# Patient Record
Sex: Male | Born: 1990 | Race: White | Hispanic: No | Marital: Single | Smoking: Never smoker
Health system: Southern US, Community
[De-identification: ages and names within clinical notes are randomized; demographics above are authoritative.]

## PROBLEM LIST (undated history)

## (undated) HISTORY — PX: APPENDECTOMY: SHX54

---

## 2020-12-26 ENCOUNTER — Emergency Department (HOSPITAL_COMMUNITY)

## 2020-12-26 ENCOUNTER — Other Ambulatory Visit: Payer: Self-pay

## 2020-12-26 ENCOUNTER — Encounter (HOSPITAL_COMMUNITY): Payer: Self-pay | Admitting: Physician Assistant

## 2020-12-26 ENCOUNTER — Emergency Department (HOSPITAL_COMMUNITY)
Admission: EM | Admit: 2020-12-26 | Discharge: 2020-12-26 | Disposition: A | Discharge status: Home / Self Care | Attending: Emergency Medicine | Admitting: Emergency Medicine

## 2020-12-26 DIAGNOSIS — Z8616 Personal history of COVID-19: Secondary | ICD-10-CM | POA: Insufficient documentation

## 2020-12-26 DIAGNOSIS — F419 Anxiety disorder, unspecified: Secondary | ICD-10-CM | POA: Diagnosis not present

## 2020-12-26 DIAGNOSIS — R42 Dizziness and giddiness: Secondary | ICD-10-CM | POA: Insufficient documentation

## 2020-12-26 DIAGNOSIS — R0789 Other chest pain: Secondary | ICD-10-CM | POA: Insufficient documentation

## 2020-12-26 DIAGNOSIS — R002 Palpitations: Secondary | ICD-10-CM

## 2020-12-26 DIAGNOSIS — R231 Pallor: Secondary | ICD-10-CM | POA: Insufficient documentation

## 2020-12-26 LAB — CBC
HCT: 44 % (ref 39.0–52.0)
Hemoglobin: 15.1 g/dL (ref 13.0–17.0)
MCH: 30.7 pg (ref 26.0–34.0)
MCHC: 34.3 g/dL (ref 30.0–36.0)
MCV: 89.4 fL (ref 80.0–100.0)
Platelets: 211 10*3/uL (ref 150–400)
RBC: 4.92 MIL/uL (ref 4.22–5.81)
RDW: 12.3 % (ref 11.5–15.5)
WBC: 5.6 10*3/uL (ref 4.0–10.5)
nRBC: 0 % (ref 0.0–0.2)

## 2020-12-26 LAB — COMPREHENSIVE METABOLIC PANEL
ALT: 27 U/L (ref 0–44)
AST: 22 U/L (ref 15–41)
Albumin: 4.5 g/dL (ref 3.5–5.0)
Alkaline Phosphatase: 60 U/L (ref 38–126)
Anion gap: 8 (ref 5–15)
BUN: 13 mg/dL (ref 6–20)
CO2: 24 mmol/L (ref 22–32)
Calcium: 9.4 mg/dL (ref 8.9–10.3)
Chloride: 110 mmol/L (ref 98–111)
Creatinine, Ser: 0.9 mg/dL (ref 0.61–1.24)
GFR, Estimated: >60 mL/min (ref >60)
Glucose, Bld: 140 mg/dL — ABNORMAL HIGH (ref 70–99)
Potassium: 3.3 mmol/L — ABNORMAL LOW (ref 3.5–5.1)
Sodium: 142 mmol/L (ref 135–145)
Total Bilirubin: 0.5 mg/dL (ref 0.3–1.2)
Total Protein: 7.4 g/dL (ref 6.5–8.1)

## 2020-12-26 LAB — D-DIMER, QUANTITATIVE: D-Dimer, Quant: <0.27 ug/mL-FEU (ref 0.00–0.50)

## 2020-12-26 LAB — TROPONIN I (HIGH SENSITIVITY): Troponin I (High Sensitivity): 3 ng/L (ref <18)

## 2020-12-26 MED ORDER — POTASSIUM CHLORIDE CRYS ER 20 MEQ PO TBCR
40.0000 meq | EXTENDED_RELEASE_TABLET | Freq: Once | ORAL | Status: AC
  Administered 2020-12-26: 40 meq via ORAL
  Filled 2020-12-26: qty 2

## 2020-12-26 MED ORDER — SODIUM CHLORIDE 0.9 % IV BOLUS
1000.0000 mL | Freq: Once | INTRAVENOUS | Status: AC
  Administered 2020-12-26: 1000 mL via INTRAVENOUS

## 2020-12-26 NOTE — ED Provider Notes (Signed)
[facility]-EMERGENCY DEPT Provider Note   CSN: [id number] Arrival date & time: 12/26/20  1708     History Chief Complaint  Patient presents with   Chest Pain    Patrick Rivera is a 30 y.o. male.  HPI   Pt Is a a 30 y/o male who presents to the ED today for eval of chest discomfort. States he was laying down when he suddenly felt palpitations and like his heart was beating fast. He further c/o some pressure that occurred with this episode. Discomfort was located to the left side of his chest and lasted about 5-10 minutes. Reports some associated clamminess to his palms and lightheadedness. Denies sob.   At present he feels some anxiety. States he was dx with COVID in January and since then he has had increased anxiety with intermittent sxs that are similar to today.   Drank about 8 drinks of etoh last night. Does not drink very frequently.   History reviewed. No pertinent past medical history.  There are no problems to display for this patient.   History reviewed. No pertinent surgical history.     History reviewed. No pertinent family history.  Social History   Tobacco Use   Smoking status: Never   Smokeless tobacco: Never    Home Medications Prior to Admission medications   Not on File    Allergies    Patient has no known allergies.  Review of Systems   Review of Systems  Constitutional:  Positive for diaphoresis. Negative for chills and fever.  HENT:  Negative for ear pain and sore throat.   Eyes:  Negative for pain and visual disturbance.  Respiratory:  Negative for cough and shortness of breath.   Cardiovascular:  Positive for chest pain and palpitations. Negative for leg swelling.  Gastrointestinal:  Negative for abdominal pain, constipation, diarrhea, nausea and vomiting.  Genitourinary:  Negative for dysuria and hematuria.  Musculoskeletal:  Negative for back pain.  Skin:  Positive for color change. Negative for rash.   Neurological:  Positive for light-headedness. Negative for seizures and syncope.  Psychiatric/Behavioral:  The patient is nervous/anxious.   All other systems reviewed and are negative.  Physical Exam Updated Vital Signs BP 132/79 (BP Location: Right Arm)   Pulse 86   Temp 98.7 F (37.1 C) (Oral)   Resp 15   Ht 5\' 10"  (1.778 m)   Wt 99.8 kg   SpO2 95%   BMI 31.57 kg/m   Physical Exam Vitals and nursing note reviewed.  Constitutional:      Appearance: He is well-developed.  HENT:     Head: Normocephalic and atraumatic.  Eyes:     Conjunctiva/sclera: Conjunctivae normal.  Cardiovascular:     Rate and Rhythm: Normal rate and regular rhythm.     Heart sounds: Normal heart sounds. No murmur heard. Pulmonary:     Effort: Pulmonary effort is normal. No respiratory distress.     Breath sounds: Normal breath sounds. No decreased breath sounds, wheezing or rales.  Chest:     Chest wall: No tenderness.  Abdominal:     General: Bowel sounds are normal.     Palpations: Abdomen is soft.     Tenderness: There is no abdominal tenderness. There is no guarding or rebound.  Musculoskeletal:     Cervical back: Neck supple.  Skin:    General: Skin is warm and dry.  Neurological:     Mental Status: He is alert.    ED Results /  Procedures / Treatments   Labs (all labs ordered are listed, but only abnormal results are displayed) Labs Reviewed  COMPREHENSIVE METABOLIC PANEL - Abnormal; Notable for the following components:      Result Value   Potassium 3.3 (*)    Glucose, Bld 140 (*)    All other components within normal limits  CBC  D-DIMER, QUANTITATIVE  TROPONIN I (HIGH SENSITIVITY)  TROPONIN I (HIGH SENSITIVITY)    EKG EKG Interpretation  Date/Time:  Saturday December 26 2020 17:55:56 EDT Ventricular Rate:  88 PR Interval:  125 QRS Duration: 99 QT Interval:  360 QTC Calculation: 436 R Axis:   16 Text Interpretation: Sinus rhythm No old tracing to compare Confirmed by  Meridee Score [phone]) on 12/26/2020 6:01:57 PM    Radiology DG Chest 2 View  Result Date: 12/26/2020 CLINICAL DATA:  Chest pain, palpitations EXAM: CHEST - 2 VIEW COMPARISON:  None. FINDINGS: The heart size and mediastinal contours are within normal limits. Both lungs are clear. The visualized skeletal structures are unremarkable. IMPRESSION: No acute abnormality of the lungs. Electronically Signed   By: Lauralyn Primes M.D.   On: 12/26/2020 19:04    Procedures Procedures   7:20 PM Cardiac monitoring reveals NSR, HR 80s (Rate & rhythm), as reviewed and interpreted by me. Cardiac monitoring was ordered due to palpitations and to monitor patient for dysrhythmia.   Medications Ordered in ED Medications  potassium chloride SA (KLOR-CON) CR tablet 40 mEq (has no administration in time range)  sodium chloride 0.9 % bolus 1,000 mL (1,000 mLs Intravenous New Bag/Given 12/26/20 1843)    ED Course  I have reviewed the triage vital signs and the nursing notes.  Pertinent labs & imaging results that were available during my care of the patient were reviewed by me and considered in my medical decision making (see chart for details).    MDM Rules/Calculators/A&P                          30 year old male presenting for evaluation of palpitations and increased anxiety since having COVID back in January.  Had an episode of palpitations today the last about 30 minutes and made him feel lightheaded.  He did have some chest discomfort during this time.  The episode has resolved on my evaluation but he states he is still feeling anxious.  His EKG shows normal sinus rhythm, no acute ischemic changes.  Chest x-ray reviewed/interpreted and shows no acute findings. CBC is unremarkable.  BMP shows mild hypokalemia.  Troponin negative, D-dimer negative, doubt PE.  Doubt dissection, esophageal rupture, or other emergent cause of symptoms at this time.  I did recommend that he follow-up with cardiology and likely  would benefit from a Holter monitor or Zio patch.  He is agreeable to do this.  We advised decreased caffeine consumption and decreased alcohol use as his palpitations could be related to this.  Advised on specific return precautions.  He voices understand the plan and reasons to return.  Questions answered.  Patient stable for discharge.   Final Clinical Impression(s) / ED Diagnoses Final diagnoses:  Palpitations    Rx / DC Orders ED Discharge Orders     None        Rayne Du 12/26/20 1920    Terrilee Files, MD 12/27/20 1005

## 2020-12-26 NOTE — ED Triage Notes (Signed)
Patient states he felt like his heart was going to jump out of chest. Lasted for 5 minutes. Happened 20 minutes ago. Says he Is in no pain right now. Patient says he has had ongoing cardiac issues since having covid.

## 2020-12-26 NOTE — Discharge Instructions (Signed)
You were given a referral to follow up with a cardiologist. They should call you to set up and appointment for follow up.   Please return to the emergency department for any new or worsening symptoms.

## 2020-12-27 ENCOUNTER — Encounter (HOSPITAL_COMMUNITY): Payer: Self-pay

## 2020-12-27 ENCOUNTER — Other Ambulatory Visit: Payer: Self-pay

## 2020-12-27 ENCOUNTER — Emergency Department (HOSPITAL_COMMUNITY)
Admission: EM | Admit: 2020-12-27 | Discharge: 2020-12-28 | Disposition: A | Discharge status: Home / Self Care | Attending: Emergency Medicine | Admitting: Emergency Medicine

## 2020-12-27 DIAGNOSIS — R0602 Shortness of breath: Secondary | ICD-10-CM | POA: Diagnosis not present

## 2020-12-27 DIAGNOSIS — G4700 Insomnia, unspecified: Secondary | ICD-10-CM | POA: Insufficient documentation

## 2020-12-27 DIAGNOSIS — E876 Hypokalemia: Secondary | ICD-10-CM | POA: Diagnosis not present

## 2020-12-27 DIAGNOSIS — R519 Headache, unspecified: Secondary | ICD-10-CM | POA: Diagnosis not present

## 2020-12-27 DIAGNOSIS — R0789 Other chest pain: Secondary | ICD-10-CM | POA: Insufficient documentation

## 2020-12-27 DIAGNOSIS — Z8616 Personal history of COVID-19: Secondary | ICD-10-CM | POA: Insufficient documentation

## 2020-12-27 NOTE — ED Triage Notes (Signed)
Pt was seen in the ED yesterday for chest pain, pt states that he continues to have the chest pain along with a headache and shortness of breath. Pt states that the pain and SHOB come and goes.

## 2020-12-27 NOTE — ED Provider Notes (Signed)
[facility]-EMERGENCY DEPT Provider Note   CSN: [id number] Arrival date & time: 12/27/20  2058     History Chief Complaint  Patient presents with   Chest Pain   Shortness of Breath   Headache    Patrick Rivera is a 30 y.o. male.  The history is provided by the patient.  Chest Pain Associated symptoms: headache and shortness of breath   Shortness of Breath Associated symptoms: chest pain and headaches   Headache He complains of chest pain which has been present intermittently since he had an episode of COVID in January.  Pain is sharp and described like it is still hole in his heart.  It had been intermittent, but it has been constant through the day today.  He had been seen in the ED yesterday for similar pain as well as palpitations, but he states he has not had any palpitations today.  Nothing makes the pain better, nothing makes it worse.  He has not taken anything for it.  There is slight associated dyspnea and he has also had some clammy palms but no overt diaphoresis.  He denies nausea.  He states that a friend of his had similar symptoms in were related to thyroid so he wants his thyroid levels checked.  He is a non-smoker and denies history of diabetes or hyperlipidemia.  He has had elevated blood pressure readings in the past but has never been diagnosed with hypertension.   History reviewed. No pertinent past medical history.  There are no problems to display for this patient.   History reviewed. No pertinent surgical history.     History reviewed. No pertinent family history.  Social History   Tobacco Use   Smoking status: Never   Smokeless tobacco: Never    Home Medications Prior to Admission medications   Not on File    Allergies    Patient has no known allergies.  Review of Systems   Review of Systems  Respiratory:  Positive for shortness of breath.   Cardiovascular:  Positive for chest pain.  Neurological:  Positive for  headaches.  All other systems reviewed and are negative.  Physical Exam Updated Vital Signs BP (!) 168/105   Pulse 66   Temp 98.6 F (37 C) (Oral)   Resp 16   SpO2 98%   Physical Exam Vitals and nursing note reviewed.  30 year old male, resting comfortably and in no acute distress. Vital signs are significant for elevated blood pressure. Oxygen saturation is 98%, which is normal. Head is normocephalic and atraumatic. PERRLA, EOMI. Oropharynx is clear. Neck is nontender and supple without adenopathy or JVD. Back is nontender and there is no CVA tenderness. Lungs are clear without rales, wheezes, or rhonchi. Chest is nontender. Heart has regular rate and rhythm without murmur. Abdomen is soft, flat, nontender without masses or hepatosplenomegaly and peristalsis is normoactive. Extremities have no cyanosis or edema, full range of motion is present. Skin is warm and dry without rash. Neurologic: Mental status is normal, cranial nerves are intact, there are no motor or sensory deficits.  ED Results / Procedures / Treatments   Labs (all labs ordered are listed, but only abnormal results are displayed) Labs Reviewed  COMPREHENSIVE METABOLIC PANEL - Abnormal; Notable for the following components:      Result Value   Potassium 3.3 (*)    All other components within normal limits  CBC WITH DIFFERENTIAL/PLATELET  TSH  D-DIMER, QUANTITATIVE  TROPONIN I (HIGH SENSITIVITY)  EKG EKG Interpretation  Date/Time:  Sunday December 27 2020 22:31:34 EDT Ventricular Rate:  68 PR Interval:  127 QRS Duration: 100 QT Interval:  388 QTC Calculation: 413 R Axis:   33 Text Interpretation: Sinus rhythm Borderline ST elevation, lateral leads When compared with ECG of 12/26/2020, No significant change was found Confirmed by Dell Briner ([id number]) on 12/27/2020 11:35:44 PM  Radiology DG Chest 2 View  Result Date: 12/28/2020 CLINICAL DATA:  Chest pain EXAM: CHEST - 2 VIEW COMPARISON:  12/26/2020  FINDINGS: The heart size and mediastinal contours are within normal limits. Both lungs are clear. The visualized skeletal structures are unremarkable. IMPRESSION: No active cardiopulmonary disease. Electronically Signed   By: Ashesh  Parikh MD   On: 12/28/2020 00:53   DG Chest 2 View  Result Date: 12/26/2020 CLINICAL DATA:  Chest pain, palpitations EXAM: CHEST - 2 VIEW COMPARISON:  None. FINDINGS: The heart size and mediastinal contours are within normal limits. Both lungs are clear. The visualized skeletal structures are unremarkable. IMPRESSION: No acute abnormality of the lungs. Electronically Signed   By: Alex  Bibbey M.D.   On: 12/26/2020 19:04    Procedures Procedures   Medications Ordered in ED Medications  potassium chloride SA (KLOR-CON) CR tablet 40 mEq (40 mEq Oral Given 12/28/20 0136)    ED Course  I have reviewed the triage vital signs and the nursing notes.  Pertinent labs & imaging results that were available during my care of the patient were reviewed by me and considered in my medical decision making (see chart for details).   MDM Rules/Calculators/A&P                         Chest pain which is quite atypical.  ECG is unchanged from yesterday.  Old records reviewed confirming ED visit yesterday for chest pain and palpitations at which time work-up was negative including D-dimer and chest x-ray.  We will repeat lab evaluation and chest x-ray, but patient was advised that definitive diagnosis was unlikely.  Will check TSH.  Labs are significant only for mild hypokalemia which had been present 2 days ago.  He is given a dose of oral potassium.  TSH is normal.  Patient is advised of these findings.  He is requesting some medication for sleep since he states he has not been able to get to sleep since he developed COVID.  He is given a prescription for small number of zolpidem tablets also given prescription for K-Dur.  He is referred to cardiology for further outpatient  work-up.  Final Clinical Impression(s) / ED Diagnoses Final diagnoses:  Atypical chest pain  Insomnia, unspecified type  Hypokalemia    Rx / DC Orders ED Discharge Orders          Ordered    zolpidem (AMBIEN) 10 MG tablet  At bedtime PRN        12/28/20 0143    potassium chloride SA (KLOR-CON) 20 MEQ tablet  2 times daily        12/28/20 0143    Ambulatory referral to Cardiology        06 /27/22 0144             02-07-1988, MD 12/28/20 [id number]

## 2020-12-28 ENCOUNTER — Emergency Department (HOSPITAL_COMMUNITY)

## 2020-12-28 LAB — COMPREHENSIVE METABOLIC PANEL
ALT: 23 U/L (ref 0–44)
AST: 20 U/L (ref 15–41)
Albumin: 4.7 g/dL (ref 3.5–5.0)
Alkaline Phosphatase: 55 U/L (ref 38–126)
Anion gap: 9 (ref 5–15)
BUN: 14 mg/dL (ref 6–20)
CO2: 27 mmol/L (ref 22–32)
Calcium: 9.9 mg/dL (ref 8.9–10.3)
Chloride: 105 mmol/L (ref 98–111)
Creatinine, Ser: 1.13 mg/dL (ref 0.61–1.24)
GFR, Estimated: >60 mL/min (ref >60)
Glucose, Bld: 91 mg/dL (ref 70–99)
Potassium: 3.3 mmol/L — ABNORMAL LOW (ref 3.5–5.1)
Sodium: 141 mmol/L (ref 135–145)
Total Bilirubin: 1 mg/dL (ref 0.3–1.2)
Total Protein: 7.5 g/dL (ref 6.5–8.1)

## 2020-12-28 LAB — CBC WITH DIFFERENTIAL/PLATELET
Abs Immature Granulocytes: 0.01 10*3/uL (ref 0.00–0.07)
Basophils Absolute: 0 10*3/uL (ref 0.0–0.1)
Basophils Relative: 1 %
Eosinophils Absolute: 0.1 10*3/uL (ref 0.0–0.5)
Eosinophils Relative: 1 %
HCT: 43.3 % (ref 39.0–52.0)
Hemoglobin: 15 g/dL (ref 13.0–17.0)
Immature Granulocytes: 0 %
Lymphocytes Relative: 34 %
Lymphs Abs: 2.3 10*3/uL (ref 0.7–4.0)
MCH: 31.4 pg (ref 26.0–34.0)
MCHC: 34.6 g/dL (ref 30.0–36.0)
MCV: 90.6 fL (ref 80.0–100.0)
Monocytes Absolute: 0.7 10*3/uL (ref 0.1–1.0)
Monocytes Relative: 10 %
Neutro Abs: 3.6 10*3/uL (ref 1.7–7.7)
Neutrophils Relative %: 54 %
Platelets: 222 10*3/uL (ref 150–400)
RBC: 4.78 MIL/uL (ref 4.22–5.81)
RDW: 12.1 % (ref 11.5–15.5)
WBC: 6.7 10*3/uL (ref 4.0–10.5)
nRBC: 0 % (ref 0.0–0.2)

## 2020-12-28 LAB — TROPONIN I (HIGH SENSITIVITY): Troponin I (High Sensitivity): 3 ng/L (ref <18)

## 2020-12-28 LAB — D-DIMER, QUANTITATIVE: D-Dimer, Quant: <0.27 ug/mL-FEU (ref 0.00–0.50)

## 2020-12-28 LAB — TSH: TSH: 2.215 u[IU]/mL (ref 0.350–4.500)

## 2020-12-28 MED ORDER — ZOLPIDEM TARTRATE 10 MG PO TABS: 10.0000 mg | ORAL_TABLET | Freq: Every evening | ORAL | 0 refills | Status: AC | PRN

## 2020-12-28 MED ORDER — POTASSIUM CHLORIDE CRYS ER 20 MEQ PO TBCR
40.0000 meq | EXTENDED_RELEASE_TABLET | Freq: Once | ORAL | Status: AC
  Administered 2020-12-28: 40 meq via ORAL
  Filled 2020-12-28: qty 2

## 2020-12-28 MED ORDER — POTASSIUM CHLORIDE CRYS ER 20 MEQ PO TBCR: 20.0000 meq | EXTENDED_RELEASE_TABLET | Freq: Two times a day (BID) | ORAL | 0 refills | Status: AC

## 2020-12-28 NOTE — Discharge Instructions (Addendum)
Your symptoms may be related to your COVID-19 infection in January.  Please follow-up with the cardiologist for further evaluation.

## 2020-12-29 ENCOUNTER — Other Ambulatory Visit: Payer: Self-pay

## 2020-12-29 ENCOUNTER — Encounter: Payer: Self-pay | Admitting: Cardiovascular Disease

## 2020-12-29 ENCOUNTER — Ambulatory Visit

## 2020-12-29 ENCOUNTER — Ambulatory Visit (INDEPENDENT_AMBULATORY_CARE_PROVIDER_SITE_OTHER): Admitting: Cardiovascular Disease

## 2020-12-29 VITALS — BP 118/82 | HR 65 | Ht 70.0 in | Wt 220.0 lb

## 2020-12-29 DIAGNOSIS — R072 Precordial pain: Secondary | ICD-10-CM | POA: Diagnosis not present

## 2020-12-29 DIAGNOSIS — Z01818 Encounter for other preprocedural examination: Secondary | ICD-10-CM

## 2020-12-29 DIAGNOSIS — R002 Palpitations: Secondary | ICD-10-CM | POA: Diagnosis not present

## 2020-12-29 DIAGNOSIS — R0602 Shortness of breath: Secondary | ICD-10-CM | POA: Insufficient documentation

## 2020-12-29 DIAGNOSIS — R0789 Other chest pain: Secondary | ICD-10-CM | POA: Diagnosis not present

## 2020-12-29 DIAGNOSIS — Z01812 Encounter for preprocedural laboratory examination: Secondary | ICD-10-CM

## 2020-12-29 NOTE — Assessment & Plan Note (Signed)
History of shortness of breath which occurs on a daily basis in addition to his chest pain since he had COVID back in January.  I am going to get a 2D echo to further evaluate

## 2020-12-29 NOTE — Progress Notes (Signed)
12/29/2020 Arta Silence   Mar 06, 1991  [id number]  Primary Physician Pcp, No Primary Cardiologist: Runell Gess MD Milagros Loll, [place], [place]  HPI:  Patrick Rivera is a 30 y.o. mildly overweight single Caucasian male who appears physically fit and was referred by the emergency room for evaluation of atypical chest pain.  He has no cardiac risk factors.  He works in [employer].  There is no family history of heart disease.  He did develop COVID-19 back in January and was not hospitalized.  He he had symptoms lasting for several weeks with a cough for last months.  Since that time he is noticed daily chest pain and tachypalpitations along with shortness of breath.  He seen in the ER over the last 2 days with chest pain and shortness of breath with unremarkable work-up.   Current Meds  Medication Sig   potassium chloride SA (KLOR-CON) 20 MEQ tablet Take 1 tablet (20 mEq total) by mouth 2 (two) times daily.   zolpidem (AMBIEN) 10 MG tablet Take 1 tablet (10 mg total) by mouth at bedtime as needed for sleep.     No Known Allergies  Social History   Socioeconomic History   Marital status: Single    Spouse name: Not on file   Number of children: Not on file   Years of education: Not on file   Highest education level: Not on file  Occupational History   Not on file  Tobacco Use   Smoking status: Never   Smokeless tobacco: Never  Substance and Sexual Activity   Alcohol use: Not on file   Drug use: Not on file   Sexual activity: Not on file  Other Topics Concern   Not on file  Social History Narrative   Not on file   Social Determinants of Health   Financial Resource Strain: Not on file  Food Insecurity: Not on file  Transportation Needs: Not on file  Physical Activity: Not on file  Stress: Not on file  Social Connections: Not on file  Intimate Partner Violence: Not on file     Review of Systems: General: negative for chills, fever, night sweats or weight changes.   Cardiovascular: negative for chest pain, dyspnea on exertion, edema, orthopnea, palpitations, paroxysmal nocturnal dyspnea or shortness of breath Dermatological: negative for rash Respiratory: negative for cough or wheezing Urologic: negative for hematuria Abdominal: negative for nausea, vomiting, diarrhea, bright red blood per rectum, melena, or hematemesis Neurologic: negative for visual changes, syncope, or dizziness All other systems reviewed and are otherwise negative except as noted above.    Blood pressure 118/82, pulse 65, height 5\' 10"  (1.778 m), weight 220 lb (99.8 kg).  General appearance: alert and no distress Neck: no adenopathy, no carotid bruit, no JVD, supple, symmetrical, trachea midline, and thyroid not enlarged, symmetric, no tenderness/mass/nodules Lungs: clear to auscultation bilaterally Heart: regular rate and rhythm, S1, S2 normal, no murmur, click, rub or gallop Extremities: extremities normal, atraumatic, no cyanosis or edema Pulses: 2+ and symmetric Skin: Skin color, texture, turgor normal. No rashes or lesions Neurologic: Grossly normal  EKG sinus rhythm at 65 without ST or T wave changes.  Personally reviewed this EKG.  ASSESSMENT AND PLAN:   Atypical chest pain [name] was recently seen in the ER by Dr. Lenna Sciara for atypical chest pain.  He has no cardiac risk factors.  He did contract COVID back in January and was fairly symptomatic for several weeks.  He had a cough for  several months after that and has developed daily chest pain with shortness of breath and tachypalpitations.  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  I am concerned about "myocarditis".  I am going to get a exercise Myoview as well as a cardiac MRI to further evaluate  Palpitations Patient has had palpitations since he developed COVID back in January.  They occur on a daily basis.  I am going to get a 2-week Zio patch to further evaluate  Shortness of breath History of shortness of  breath which occurs on a daily basis in addition to his chest pain since he had COVID back in January.  I am going to get a 2D echo to further evaluate     Runell Gess MD [facility], [facility] 12/29/2020 2:03 PM

## 2020-12-29 NOTE — Progress Notes (Unsigned)
Enrolled patient for a 14 day Zio XT  monitor to be mailed to patients home  °

## 2020-12-29 NOTE — Patient Instructions (Signed)
Medication Instructions:  Your Physician recommend you continue on your current medication as directed.    *If you need a refill on your cardiac medications before your next appointment, please call your pharmacy*   Lab Work: Your physician recommends lab work today (BMP).  If you have labs (blood work) drawn today and your tests are completely normal, you will receive your results only by: MyChart Message (if you have MyChart) OR A paper copy in the mail If you have any lab test that is abnormal or we need to change your treatment, we will call you to review the results.   Testing/Procedures: Your physician has requested that you have an echocardiogram. Echocardiography is a painless test that uses sound waves to create images of your heart. It provides your doctor with information about the size and shape of your heart and how well your heart's chambers and valves are working. This procedure takes approximately one hour. There are no restrictions for this procedure. [address]  Your physician has requested that you have en exercise stress myoview. For further information please visit [email or web address] Please follow instruction sheet, as given.  [address]  Your physician has requested that you have a cardiac MRI. Cardiac MRI uses a computer to create images of your heart as its beating, producing both still and moving pictures of your heart and major blood vessels. For further information please visit [email or web address]. Please follow the instruction sheet given to you today for more information.  [facility]  Our physician has recommended that you wear an  14 DAY ZIO-PATCH monitor. The Zio patch cardiac monitor continuously records heart rhythm data for up to 14 days, this is for patients being evaluated for multiple types heart rhythms. For the first 24 hours post application, please avoid getting the Zio monitor wet in the shower or  by excessive sweating during exercise. After that, feel free to carry on with regular activities. Keep soaps and lotions away from the ZIO XT Patch.   Someone from our office will call to verify address and mail monitor.    Follow-Up: At [facility], you and your health needs are our priority.  As part of our continuing mission to provide you with exceptional heart care, we have created designated Provider Care Teams.  These Care Teams include your primary Cardiologist (physician) and Advanced Practice Providers (APPs -  Physician Assistants and Nurse Practitioners) who all work together to provide you with the care you need, when you need it.  We recommend signing up for the patient portal called "MyChart".  Sign up information is provided on this After Visit Summary.  MyChart is used to connect with patients for Virtual Visits (Telemedicine).  Patients are able to view lab/test results, encounter notes, upcoming appointments, etc.  Non-urgent messages can be sent to your provider as well.   To learn more about what you can do with MyChart, go to [email or web address].    Your next appointment:   2 week(s)  The format for your next appointment:   In Person  Provider:   Nanetta Batty, MD  You are scheduled for a Myocardial Perfusion Imaging Study.  Please arrive 15 minutes prior to your appointment time for registration and insurance purposes.  The test will take approximately 3 to 4 hours to complete; you may bring reading material.  If someone comes with you to your appointment, they will need to remain in the main lobby due to limited  space in the testing area. **If you are pregnant or breastfeeding, please notify the nuclear lab prior to your appointment**  How to prepare for your Myocardial Perfusion Test: Do not eat or drink 3 hours prior to your test, except you may have water. Do not consume products containing caffeine (regular or decaffeinated) 12 hours prior to your test.  (ex: coffee, chocolate, sodas, tea). Do bring a list of your current medications with you.  If not listed below, you may take your medications as normal. Do wear comfortable clothes (no dresses or overalls) and walking shoes, tennis shoes preferred (No heels or open toe shoes are allowed). Do NOT wear cologne, perfume, aftershave, or lotions (deodorant is allowed). If these instructions are not followed, your test will have to be rescheduled.  Please report to [address], [address] for your test.  If you have questions or concerns about your appointment, you can call the Nuclear Lab at [phone].  If you cannot keep your appointment, please provide 24 hours notification to the Nuclear Lab, to avoid a possible $50 charge to your account.

## 2020-12-29 NOTE — Assessment & Plan Note (Signed)
Patient has had palpitations since he developed COVID back in January.  They occur on a daily basis.  I am going to get a 2-week Zio patch to further evaluate

## 2020-12-29 NOTE — Assessment & Plan Note (Signed)
Patrick Rivera was recently seen in the ER by Dr. Preston Fleeting for atypical chest pain.  He has no cardiac risk factors.  He did contract COVID back in January and was fairly symptomatic for several weeks.  He had a cough for several months after that and has developed daily chest pain with shortness of breath and tachypalpitations.  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  I am concerned about "myocarditis".  I am going to get a exercise Myoview as well as a cardiac MRI to further evaluate

## 2020-12-30 ENCOUNTER — Emergency Department (HOSPITAL_COMMUNITY)
Admission: EM | Admit: 2020-12-30 | Discharge: 2020-12-30 | Discharge status: Against Medical Advice | Attending: Emergency Medicine | Admitting: Emergency Medicine

## 2020-12-30 ENCOUNTER — Ambulatory Visit (HOSPITAL_BASED_OUTPATIENT_CLINIC_OR_DEPARTMENT_OTHER)

## 2020-12-30 ENCOUNTER — Encounter (HOSPITAL_COMMUNITY): Payer: Self-pay

## 2020-12-30 ENCOUNTER — Emergency Department (HOSPITAL_COMMUNITY)

## 2020-12-30 DIAGNOSIS — N4889 Other specified disorders of penis: Secondary | ICD-10-CM | POA: Insufficient documentation

## 2020-12-30 DIAGNOSIS — R339 Retention of urine, unspecified: Secondary | ICD-10-CM | POA: Insufficient documentation

## 2020-12-30 DIAGNOSIS — R079 Chest pain, unspecified: Secondary | ICD-10-CM | POA: Insufficient documentation

## 2020-12-30 DIAGNOSIS — R519 Headache, unspecified: Secondary | ICD-10-CM | POA: Diagnosis present

## 2020-12-30 DIAGNOSIS — R072 Precordial pain: Secondary | ICD-10-CM

## 2020-12-30 DIAGNOSIS — G44209 Tension-type headache, unspecified, not intractable: Secondary | ICD-10-CM

## 2020-12-30 LAB — MYOCARDIAL PERFUSION IMAGING
Estimated workload: 13.4 METS
Exercise duration (min): 12 min
Exercise duration (sec): 0 s
LV dias vol: 85 mL (ref 62–150)
LV sys vol: 36 mL
MPHR: 190 {beats}/min
Peak HR: 181 {beats}/min
Percent HR: 95 %
Rest HR: 64 {beats}/min
SDS: 1
SRS: 0
SSS: 1
TID: 0.88

## 2020-12-30 LAB — BASIC METABOLIC PANEL
BUN/Creatinine Ratio: 13 (ref 9–20)
BUN: 15 mg/dL (ref 6–20)
CO2: 20 mmol/L (ref 20–29)
Calcium: 9.9 mg/dL (ref 8.7–10.2)
Chloride: 101 mmol/L (ref 96–106)
Creatinine, Ser: 1.16 mg/dL (ref 0.76–1.27)
Glucose: 80 mg/dL (ref 65–99)
Potassium: 4.1 mmol/L (ref 3.5–5.2)
Sodium: 140 mmol/L (ref 134–144)
eGFR: 87 mL/min/{1.73_m2} (ref >59)

## 2020-12-30 LAB — URINALYSIS, ROUTINE W REFLEX MICROSCOPIC
Bilirubin Urine: NEGATIVE
Glucose, UA: NEGATIVE mg/dL
Hgb urine dipstick: NEGATIVE
Ketones, ur: 5 mg/dL — AB
Leukocytes,Ua: NEGATIVE
Nitrite: NEGATIVE
Protein, ur: NEGATIVE mg/dL
Specific Gravity, Urine: 1.028 (ref 1.005–1.030)
pH: 5 (ref 5.0–8.0)

## 2020-12-30 MED ORDER — KETOROLAC TROMETHAMINE 30 MG/ML IJ SOLN
30.0000 mg | Freq: Once | INTRAMUSCULAR | Status: AC
  Administered 2020-12-30: 30 mg via INTRAMUSCULAR
  Filled 2020-12-30: qty 1

## 2020-12-30 MED ORDER — TECHNETIUM TC 99M TETROFOSMIN IV KIT
11.0000 | PACK | Freq: Once | INTRAVENOUS | Status: AC | PRN
Start: 2020-12-30 — End: 2020-12-30
  Administered 2020-12-30: 11 via INTRAVENOUS
  Filled 2020-12-30: qty 11

## 2020-12-30 MED ORDER — TECHNETIUM TC 99M TETROFOSMIN IV KIT
32.3000 | PACK | Freq: Once | INTRAVENOUS | Status: AC | PRN
Start: 2020-12-30 — End: 2020-12-30
  Administered 2020-12-30: 32.3 via INTRAVENOUS
  Filled 2020-12-30: qty 33

## 2020-12-30 NOTE — ED Provider Notes (Signed)
[facility]-EMERGENCY DEPT Provider Note   CSN: [id number] Arrival date & time: 12/30/20  0806     History Chief Complaint  Patient presents with   Headache    Patrick Rivera is a 30 y.o. male.  HPI  Patient presents with headache and clammy hands x2 days.  Headaches last for hours but are not associated with nausea vomiting photophobia or photophobia.  Is not having any lightheadedness or vision changes.  Has not tried any alleviating factors.  No head trauma.  Headache described as a throbbing circumferential pain.  Patient also is here with burning at the penis x3 weeks.  He initially checked which is negative.  He also had a urine analysis at the urgent care which was reportedly negative as well.  He is not having any hematuria, penile discharge, new rashes, unprotected sex, testicular swelling, flank pain.  No dysuria.  He does endorse some increased urinary frequency.  He voices concern that he may have AIDS.  Patient has intermittent chest pain for which he has been seen twice in the ED the last week and seen by cardiology in the office yesterday.  He has diagnostic imaging studies at 10:30 AM this morning and is anxious about making that appointment.  The chest pain is associated with shortness of breath. History reviewed. No pertinent past medical history.  Patient Active Problem List   Diagnosis Date Noted   Atypical chest pain 12/29/2020   Palpitations 12/29/2020   Shortness of breath 12/29/2020    History reviewed. No pertinent surgical history.     No family history on file.  Social History   Tobacco Use   Smoking status: Never   Smokeless tobacco: Never    Home Medications Prior to Admission medications   Medication Sig Start Date End Date Taking? Authorizing Provider  potassium chloride SA (KLOR-CON) 20 MEQ tablet Take 1 tablet (20 mEq total) by mouth 2 (two) times daily. 12/28/20   Dione Booze, MD  zolpidem (AMBIEN) 10 MG tablet Take  1 tablet (10 mg total) by mouth at bedtime as needed for sleep. 12/28/20   Dione Booze, MD    Allergies    Patient has no known allergies.  Review of Systems   Review of Systems  Constitutional:  Negative for fever.  Respiratory:  Positive for shortness of breath.   Cardiovascular:  Positive for chest pain. Negative for leg swelling.  Gastrointestinal:  Negative for nausea and vomiting.  Genitourinary:  Positive for dysuria and frequency. Negative for decreased urine volume, flank pain, hematuria, penile discharge, penile pain, penile swelling, scrotal swelling, testicular pain and urgency.  Skin:  Negative for rash.  Neurological:  Positive for headaches. Negative for syncope, weakness and numbness.  Psychiatric/Behavioral:  The patient is nervous/anxious.    Physical Exam Updated Vital Signs BP (!) 147/102 (BP Location: Left Arm)   Pulse 74   Temp 98.1 F (36.7 C) (Oral)   Resp 16   Ht 5\' 11"  (1.803 m)   Wt 102.1 kg   SpO2 99%   BMI 31.38 kg/m   Physical Exam Vitals and nursing note reviewed. Exam conducted with a chaperone present.  Constitutional:      Appearance: Normal appearance.  HENT:     Head: Normocephalic and atraumatic.  Eyes:     General: No scleral icterus.       Right eye: No discharge.        Left eye: No discharge.     Extraocular Movements: Extraocular movements  intact.     Pupils: Pupils are equal, round, and reactive to light.  Cardiovascular:     Rate and Rhythm: Normal rate and regular rhythm.     Pulses: Normal pulses.     Heart sounds: Normal heart sounds. No murmur heard.   No friction rub. No gallop.  Pulmonary:     Effort: Pulmonary effort is normal. No respiratory distress.     Breath sounds: Normal breath sounds.     Comments: Speaking in complete sentences.  99% on room air.  Lungs are clear to auscultation bilaterally. Abdominal:     General: Abdomen is flat. Bowel sounds are normal. There is no distension.     Palpations: Abdomen  is soft.     Tenderness: There is no abdominal tenderness.  Genitourinary:    Pubic Area: No rash.      Penis: Circumcised. No phimosis, erythema, tenderness, discharge or swelling.      Testes: Normal. Cremasteric reflex is present.        Right: Tenderness or swelling not present.        Left: Tenderness or swelling not present.     Epididymis:     Right: Normal.     Comments: Male chaperone present.  No urethral discharge or erythema.  No testicular swelling or pain to palpation.  No obvious rash appreciated. Skin:    General: Skin is warm and dry.     Coloration: Skin is not jaundiced.  Neurological:     Mental Status: He is alert. Mental status is at baseline.     Cranial Nerves: No cranial nerve deficit or dysarthria.     Sensory: No sensory deficit.     Motor: No weakness.     Coordination: Coordination normal.     Comments: Cranial nerves III through XII intact.  Grip strength symmetric and 5 out of 5.  Lower extremity strength 5 out of 5.  Dorsiflexion plantarflexion 5 out of 5.  Sensation to light touch grossly intact.  Psychiatric:        Mood and Affect: Mood is anxious.    ED Results / Procedures / Treatments   Labs (all labs ordered are listed, but only abnormal results are displayed) Labs Reviewed - No data to display  EKG None  Radiology No results found.  Procedures Procedures   Medications Ordered in ED Medications  ketorolac (TORADOL) 30 MG/ML injection 30 mg (has no administration in time range)    ED Course  I have reviewed the triage vital signs and the nursing notes.  Pertinent labs & imaging results that were available during my care of the patient were reviewed by me and considered in my medical decision making (see chart for details).  Clinical Course as of 12/30/20 1117  Wed Dec 30, 2020  1610 Patient requesting to leave for results of the urine.  Advises work-up is not complete, but patient states he needs to get to his imaging  appointment.  He will view the results on MyChart. [HS]    Clinical Course User Index [HS] Theron Arista, PA-C   MDM Rules/Calculators/A&P                         Patient is a 30 year old male with multiple visits in the last week for chest pain presenting with headache, burning at the penis, chest pain, shortness of breath.  His primary reason for visit today is burning at the head of the penis, with a  secondary concern for headache.  Headache-No focal deficits on neuro exam, no head trauma.  I suspect that the headache is likely a tension headache, but given that the patient has been having them for the past few ED visits and has not had a head CT yet I will order that for reassurance.  Overall, very low suspicion for [facility].  Will treat symptomatically and reassess.  Increased urinary frequency and burning at the head of the penis-genital exam normal.  I do not suspect urethritis, STD causing rash, kidney stone given lack of flank pain or abdominal pain,.  Pain is not consistent with testicular torsion given that it is localized to the head of the penis and testicles are without pain.  Cremaster reflex is also intact.  Patient may have a UTI.  We will recheck urine and collect culture.  We will also recheck STDs.  CT head negative.  Patient decided to leave AMA before the urine results came back.  He states he needs to get to his imaging study and will follow up on chart review.  I doubt emergent etiology given his extensive work-ups in the last week.  Cardiology will be able to provide further evaluation.  Based on today's work-up I believe his headaches are tension headaches.  Final Clinical Impression(s) / ED Diagnoses Final diagnoses:  None    Rx / DC Orders ED Discharge Orders     None        Theron Arista, PA-C 12/30/20 1118    Alvira Monday, MD 01/19/21 1617

## 2020-12-30 NOTE — Addendum Note (Signed)
Addended by: Runell Gess on: 12/30/2020 07:58 AM   Modules accepted: Orders

## 2020-12-30 NOTE — Addendum Note (Signed)
Addended by: Parke Poisson on: 12/30/2020 07:54 AM   Modules accepted: Orders

## 2020-12-30 NOTE — ED Notes (Signed)
Pt signed AMA form, ambulates independently, steady gait, A&O X4

## 2020-12-30 NOTE — ED Notes (Signed)
Pt wants to leave, states "I need to get going". Provider made aware

## 2020-12-30 NOTE — ED Triage Notes (Addendum)
C/o severe headache and clammy hands X1 day. Denies trauma or falling.   Patient reports a "little chest pain" on the left side and a "little bit of shortness breath".   Has a referral to cardiology and suppose to have a stress test today.   Also c/o penis pain to tip of head X 3 weeks per patient. Per patient he has had STD check and was negative.   Denies N/V.   Patient reports diarrhea after drinking 2 days ago.    A/ox4 Ambulatory in triage.   7/10 generalized pain.

## 2020-12-31 ENCOUNTER — Encounter (HOSPITAL_COMMUNITY): Payer: Self-pay

## 2020-12-31 ENCOUNTER — Emergency Department (HOSPITAL_COMMUNITY)
Admission: EM | Admit: 2020-12-31 | Discharge: 2020-12-31 | Disposition: A | Discharge status: Home / Self Care | Attending: Emergency Medicine | Admitting: Emergency Medicine

## 2020-12-31 ENCOUNTER — Emergency Department (HOSPITAL_COMMUNITY)

## 2020-12-31 ENCOUNTER — Other Ambulatory Visit: Payer: Self-pay

## 2020-12-31 DIAGNOSIS — N50819 Testicular pain, unspecified: Secondary | ICD-10-CM | POA: Diagnosis present

## 2020-12-31 DIAGNOSIS — R519 Headache, unspecified: Secondary | ICD-10-CM | POA: Diagnosis not present

## 2020-12-31 DIAGNOSIS — N433 Hydrocele, unspecified: Secondary | ICD-10-CM

## 2020-12-31 LAB — URINE CULTURE: Culture: NO GROWTH

## 2020-12-31 LAB — GC/CHLAMYDIA PROBE AMP (~~LOC~~) NOT AT ARMC
Chlamydia: NEGATIVE
Comment: NEGATIVE
Comment: NORMAL
Neisseria Gonorrhea: NEGATIVE

## 2020-12-31 LAB — RAPID HIV SCREEN (HIV 1/2 AB+AG)
HIV 1/2 Antibodies: NONREACTIVE
HIV-1 P24 Antigen - HIV24: NONREACTIVE

## 2020-12-31 NOTE — ED Notes (Signed)
Pt ambulates independently, steady gait 

## 2020-12-31 NOTE — ED Triage Notes (Signed)
Patient c/o penis and scrotal pain x 4 weeks. Patient denies any penile drainage. Patient denies any injury.

## 2020-12-31 NOTE — ED Provider Notes (Signed)
[facility]   CSN: [id number] Arrival date & time: 12/31/20  1313     History Chief Complaint  Patient presents with   Testicle Pain    Patrick Rivera is a 30 y.o. male.  HPI  Patient presents with sensation of hot and cold to the testicles.  States has been going on about a month.  Denies any pain but states he has odd sensations to testicles such as feeling cold or hot. He is unable to quantify the duration it lasts when it occurs of how often it does. States it gets better at night when he lays flat.  Worse when he is walking.   He denies any penile discharge, Dysuria but endorses polyuria.  He has had multiple ED visits in the past week.  He was seen by me yesterday for increased urinary frequency and a burning sensation to the head of his penis after he pees as well as a headache.  History reviewed. No pertinent past medical history.  Patient Active Problem List   Diagnosis Date Noted   Atypical chest pain 12/29/2020   Palpitations 12/29/2020   Shortness of breath 12/29/2020    Past Surgical History:  Procedure Laterality Date   APPENDECTOMY         Family History  Problem Relation Age of Onset   Cancer Father     Social History   Tobacco Use   Smoking status: Never   Smokeless tobacco: Never  Vaping Use   Vaping Use: Never used  Substance Use Topics   Alcohol use: Yes    Comment: occasionally   Drug use: Never    Home Medications Prior to Admission medications   Medication Sig Start Date End Date Taking? Authorizing Provider  potassium chloride SA (KLOR-CON) 20 MEQ tablet Take 1 tablet (20 mEq total) by mouth 2 (two) times daily. 12/28/20  Yes Dione Booze, MD  zolpidem (AMBIEN) 10 MG tablet Take 1 tablet (10 mg total) by mouth at bedtime as needed for sleep. 12/28/20  Yes Dione Booze, MD    Allergies    Patient has no known allergies.  Review of Systems   Review of Systems  Constitutional:   Negative for fatigue and fever.  Gastrointestinal:  Negative for abdominal pain, anal bleeding, diarrhea, nausea and vomiting.  Endocrine: Positive for polyuria.  Genitourinary:  Negative for decreased urine volume, difficulty urinating, dysuria, flank pain, frequency, penile discharge, penile pain, penile swelling and urgency.       Testicular chills and burning sensation  Skin:  Negative for color change and rash.   Physical Exam Updated Vital Signs BP (!) 119/42   Pulse (!) 59   Temp 98.2 F (36.8 C) (Oral)   Resp 17   Ht 5\' 11"  (1.803 m)   Wt 102.1 kg   SpO2 96%   BMI 31.38 kg/m   Physical Exam Vitals and nursing note reviewed. Exam conducted with a chaperone present.  Constitutional:      General: He is not in acute distress.    Appearance: Normal appearance.     Comments: Resting comfortably, no acute distress.  HENT:     Head: Normocephalic and atraumatic.  Eyes:     General: No scleral icterus.    Extraocular Movements: Extraocular movements intact.     Pupils: Pupils are equal, round, and reactive to light.  Abdominal:     General: Abdomen is flat.     Tenderness: There is no abdominal tenderness.  Hernia: There is no hernia in the left inguinal area or right inguinal area.  Genitourinary:    Pubic Area: No rash.      Penis: Normal and circumcised. No phimosis, paraphimosis, erythema, tenderness, discharge, swelling or lesions.      Testes: Normal. Cremasteric reflex is present.        Right: Mass, tenderness or swelling not present.        Left: Mass, tenderness or swelling not present.     Epididymis:     Right: Normal. Not inflamed or enlarged. No mass or tenderness.     Left: Normal. Not inflamed or enlarged. No mass or tenderness.     Comments: There is no obvious rash or chancre.  No discharge, no tenderness to exam, no swelling, no erythema, no warmth. Skin:    Coloration: Skin is not jaundiced.  Neurological:     Mental Status: He is alert. Mental  status is at baseline.     Coordination: Coordination normal.    ED Results / Procedures / Treatments   Labs (all labs ordered are listed, but only abnormal results are displayed) Labs Reviewed  RAPID HIV SCREEN (HIV 1/2 AB+AG)  RPR    EKG None  Radiology CT Head Wo Contrast  Result Date: 12/30/2020 CLINICAL DATA:  Headaches for several months with dizziness, initial encounter EXAM: CT HEAD WITHOUT CONTRAST TECHNIQUE: Contiguous axial images were obtained from the base of the skull through the vertex without intravenous contrast. COMPARISON:  None. FINDINGS: Brain: No evidence of acute infarction, hemorrhage, hydrocephalus, extra-axial collection or mass lesion/mass effect. Vascular: No hyperdense vessel or unexpected calcification. Skull: Normal. Negative for fracture or focal lesion. Sinuses/Orbits: No acute finding. Other: None. IMPRESSION: No acute intracranial abnormality noted. Electronically Signed   By: Alcide Clever M.D.   On: 12/30/2020 09:33   MYOCARDIAL PERFUSION IMAGING  Result Date: 12/30/2020  The left ventricular ejection fraction is normal (55-65%).  Nuclear stress EF: 57%.  There was no ST segment deviation noted during stress.  No T wave inversion was noted during stress.  The study is normal.  This is a low risk study.  1.  Normal perfusion study without evidence of ischemia or infarction. 2.  Normal LVEF, 57%. 3.  Excellent functional capacity (12:00 min:s; 13.4 METS). 4.  Normal HR/BP response to exercise. 5.  This is a low risk study.   US SCROTUM W/DOPPLER  Result Date: 12/31/2020 CLINICAL DATA:  Testicular pain for several weeks EXAM: SCROTAL ULTRASOUND DOPPLER ULTRASOUND OF THE TESTICLES TECHNIQUE: Complete ultrasound examination of the testicles, epididymis, and other scrotal structures was performed. Color and spectral Doppler ultrasound were also utilized to evaluate blood flow to the testicles. COMPARISON:  None. FINDINGS: Right testicle Measurements: 4.2  x 2.4 x 2.5 cm. No mass or microlithiasis visualized. Left testicle Measurements: 0.6 x 2.8 x 2.4 cm. No mass or microlithiasis visualized. Right epididymis:  Normal in size and appearance. Left epididymis:  Normal in size and appearance. Hydrocele:  Small left hydrocele is noted. Varicocele:  None visualized. Pulsed Doppler interrogation of both testes demonstrates normal low resistance arterial and venous waveforms bilaterally. IMPRESSION: Small left hydrocele. No testicular abnormality is identified. Electronically Signed   By: Alcide Clever M.D.   On: 12/31/2020 15:27    Procedures Procedures   Medications Ordered in ED Medications - No data to display  ED Course  I have reviewed the triage vital signs and the nursing notes.  Pertinent labs & imaging results that were  available during my care of the patient were reviewed by me and considered in my medical decision making (see chart for details).  Clinical Course as of 12/31/20 1657  Thu Dec 31, 2020  1540 US SCROTUM W/DOPPLER [HS]  5176 Small left hydrocele. No torsion or masses  [HS]    Clinical Course User Index [HS] Theron Arista, PA-C   MDM Rules/Calculators/A&P                          Patient is a 31 year old male presenting with sensation that there is burning and chills to his testicles.  He is very concerned about STDs.  He was screened and tested negative for gonorrhea and chlamydia, but I will test him also for syphilis and HIV per his insistence.  I have very low suspicion for testicular torsion given the duration of the symptoms and the alleviating and aggravating factors.  However, I will order to rule out torsion or any mass.  Patient was tested for UTI yesterday and was negative.  Considered possible diabetes as a cause for his probably urea, but patient has not had elevated glucose on any of his labs in the last couple weeks in the ED.  He also does not have risk factors for diabetes.  Very low suspicion.   Based on  work-up, history, physical exam I have a very low suspicion for any emergent disease.  His vitals are stable, his labs have resulted normal, the ultrasound is negative for masses or testicular torsion.    Discussed that although I am not able to identify a reason for him to feel cold and warm in his testicles, I do not believe it is an emergent process and it should be followed up with outpatient.  Discussed establishing a primary care again with the patient, who is adamant that due to his work he is in a different city or state every week so this is not an option for him.  I have advised him to call his insurance company to inquire about telehealth options for him.  Discussed that he needs to follow-up with a urologist for further evaluation.  HIV test was nonreactive per call with the lab. syphilis test is still pending and he understands he will notified if positive. Patient is stable and appropriate for discharge. He agrees with the plan and voices understanding.   Final Clinical Impression(s) / ED Diagnoses Final diagnoses:  Hydrocele in adult    Rx / DC Orders ED Discharge Orders     None        Theron Arista, PA-C 12/31/20 1659    Jacalyn Lefevre, MD 01/01/21 [phone]

## 2020-12-31 NOTE — Discharge Instructions (Addendum)
You are seen today in the ED for atypical sensation in the testicles.  The ultrasound was negative for testicular torsion or testicular masses.  There was an incidental finding of a hydrocele, which as we discussed I do not think is contributing to your symptoms.  I have included information he can read about the condition as well as surgical correction if that is something you would like to pursue.  I have given you a referral for urologist, please call them and set up an appointment as soon as you can.  Your HIV test was nonreactive, syphilis test takes more than a day to do and you will receive a call if it results positive.  Please call your insurance company and discuss options for telehealth for establishing care with a primary care provider.

## 2021-01-01 ENCOUNTER — Ambulatory Visit (HOSPITAL_COMMUNITY): Admission: RE | Admit: 2021-01-01 | Source: Ambulatory Visit

## 2021-01-01 ENCOUNTER — Ambulatory Visit (HOSPITAL_COMMUNITY)
Admission: RE | Admit: 2021-01-01 | Discharge: 2021-01-01 | Disposition: A | Discharge status: Home / Self Care | Source: Ambulatory Visit | Attending: Cardiovascular Disease | Admitting: Cardiovascular Disease

## 2021-01-01 DIAGNOSIS — R0602 Shortness of breath: Secondary | ICD-10-CM | POA: Insufficient documentation

## 2021-01-01 LAB — RPR: RPR Ser Ql: NONREACTIVE

## 2021-01-01 LAB — ECHOCARDIOGRAM COMPLETE
Area-P 1/2: 2.29 cm2
S' Lateral: 3 cm

## 2021-01-08 ENCOUNTER — Ambulatory Visit: Admitting: Cardiovascular Disease

## 2021-01-14 ENCOUNTER — Encounter: Payer: Self-pay | Admitting: *Deleted

## 2021-02-08 ENCOUNTER — Telehealth (HOSPITAL_COMMUNITY): Payer: Self-pay | Admitting: Emergency Medicine

## 2021-02-08 NOTE — Telephone Encounter (Signed)
VM box full cannot leave new message  Rockwell Alexandria RN Navigator Cardiac Imaging [facility] [phone] Office  [phone] Cell

## 2021-02-09 ENCOUNTER — Ambulatory Visit (HOSPITAL_COMMUNITY): Admission: RE | Admit: 2021-02-09 | Source: Ambulatory Visit

## 2021-04-20 ENCOUNTER — Ambulatory Visit: Admitting: Cardiovascular Disease

## 2022-01-16 IMAGING — CR DG CHEST 2V
2 series · 2 of 2 positions shown · non-contrast
Comparison: None.

CLINICAL DATA: Chest pain, palpitations

EXAM:
CHEST - 2 VIEW

[w chest pa]
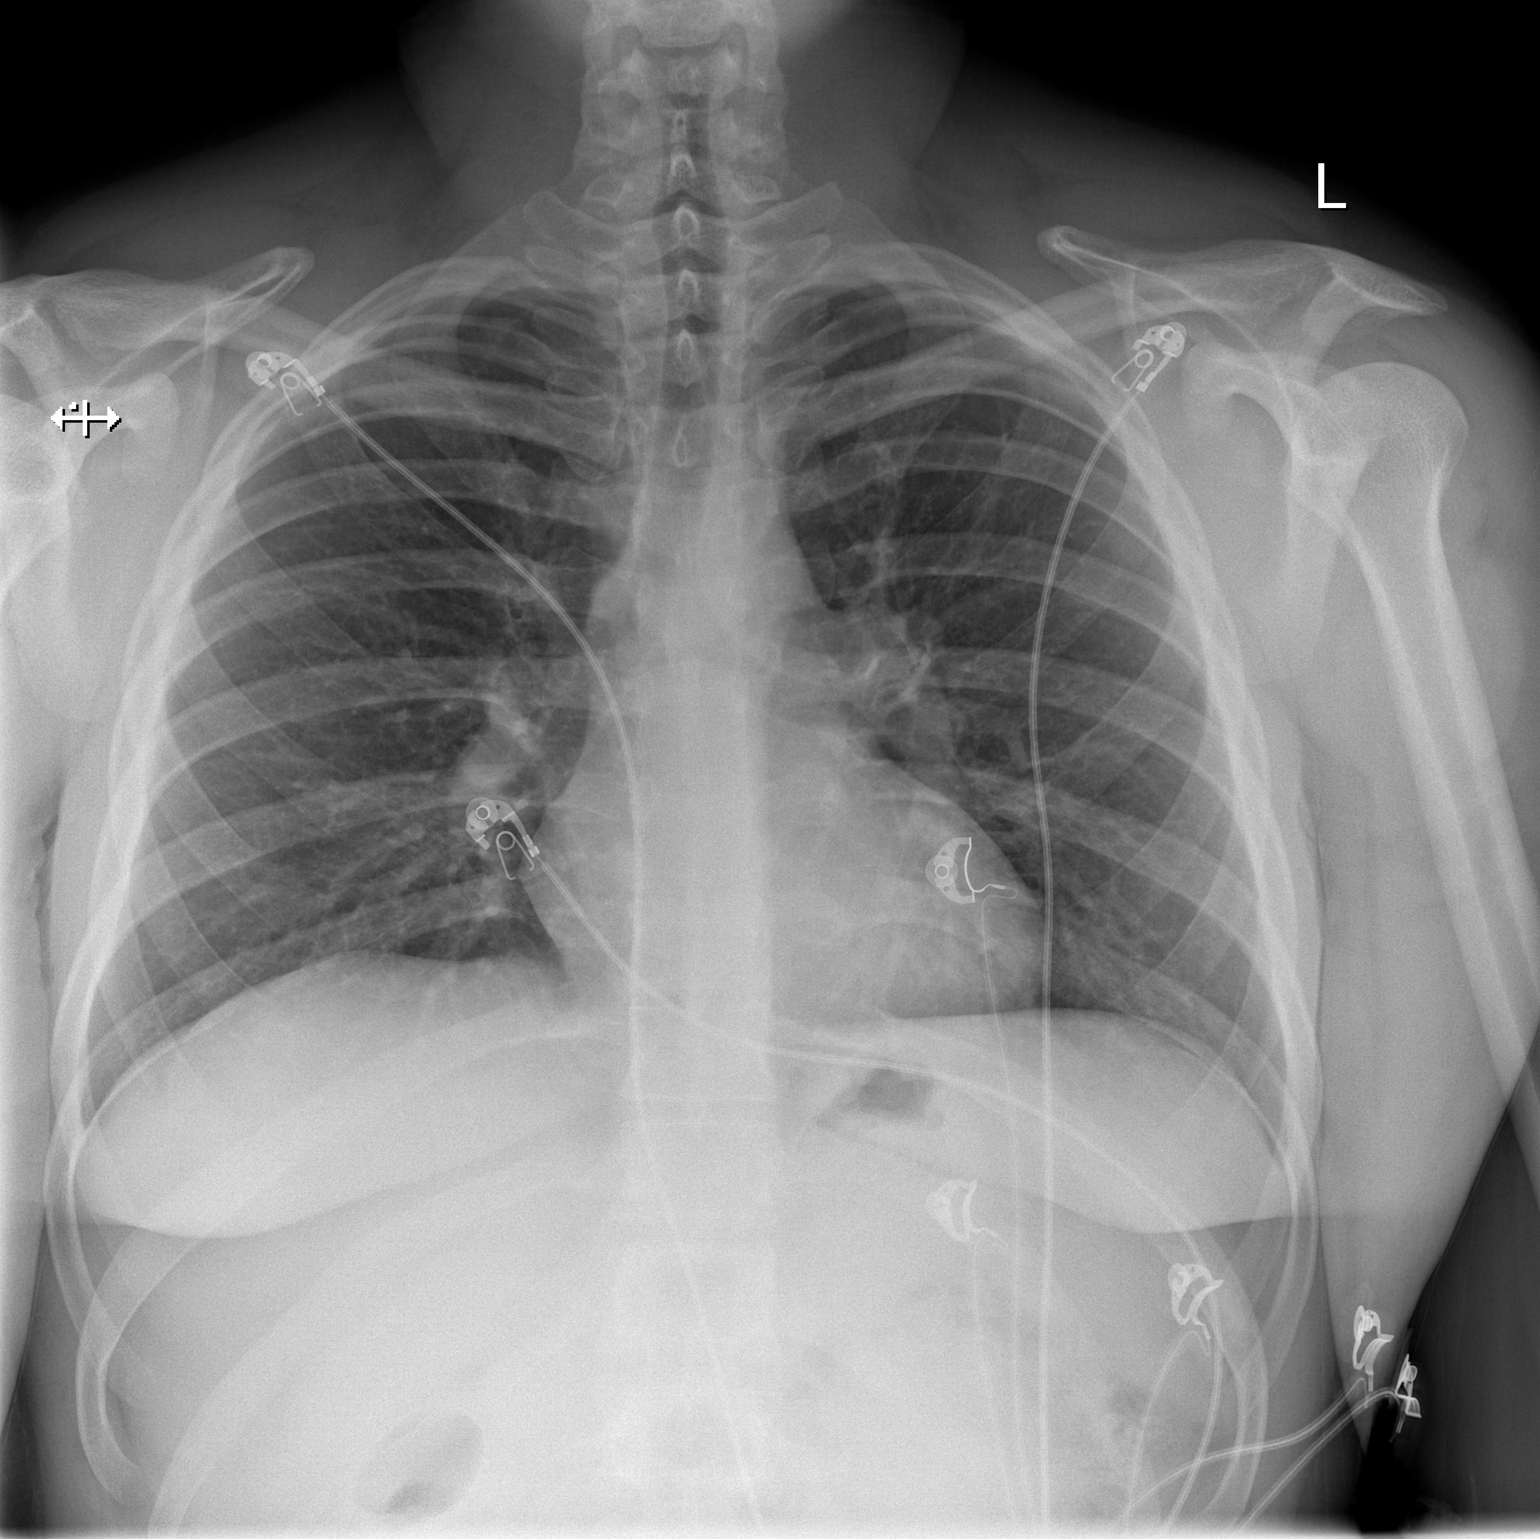

[w chest lat]
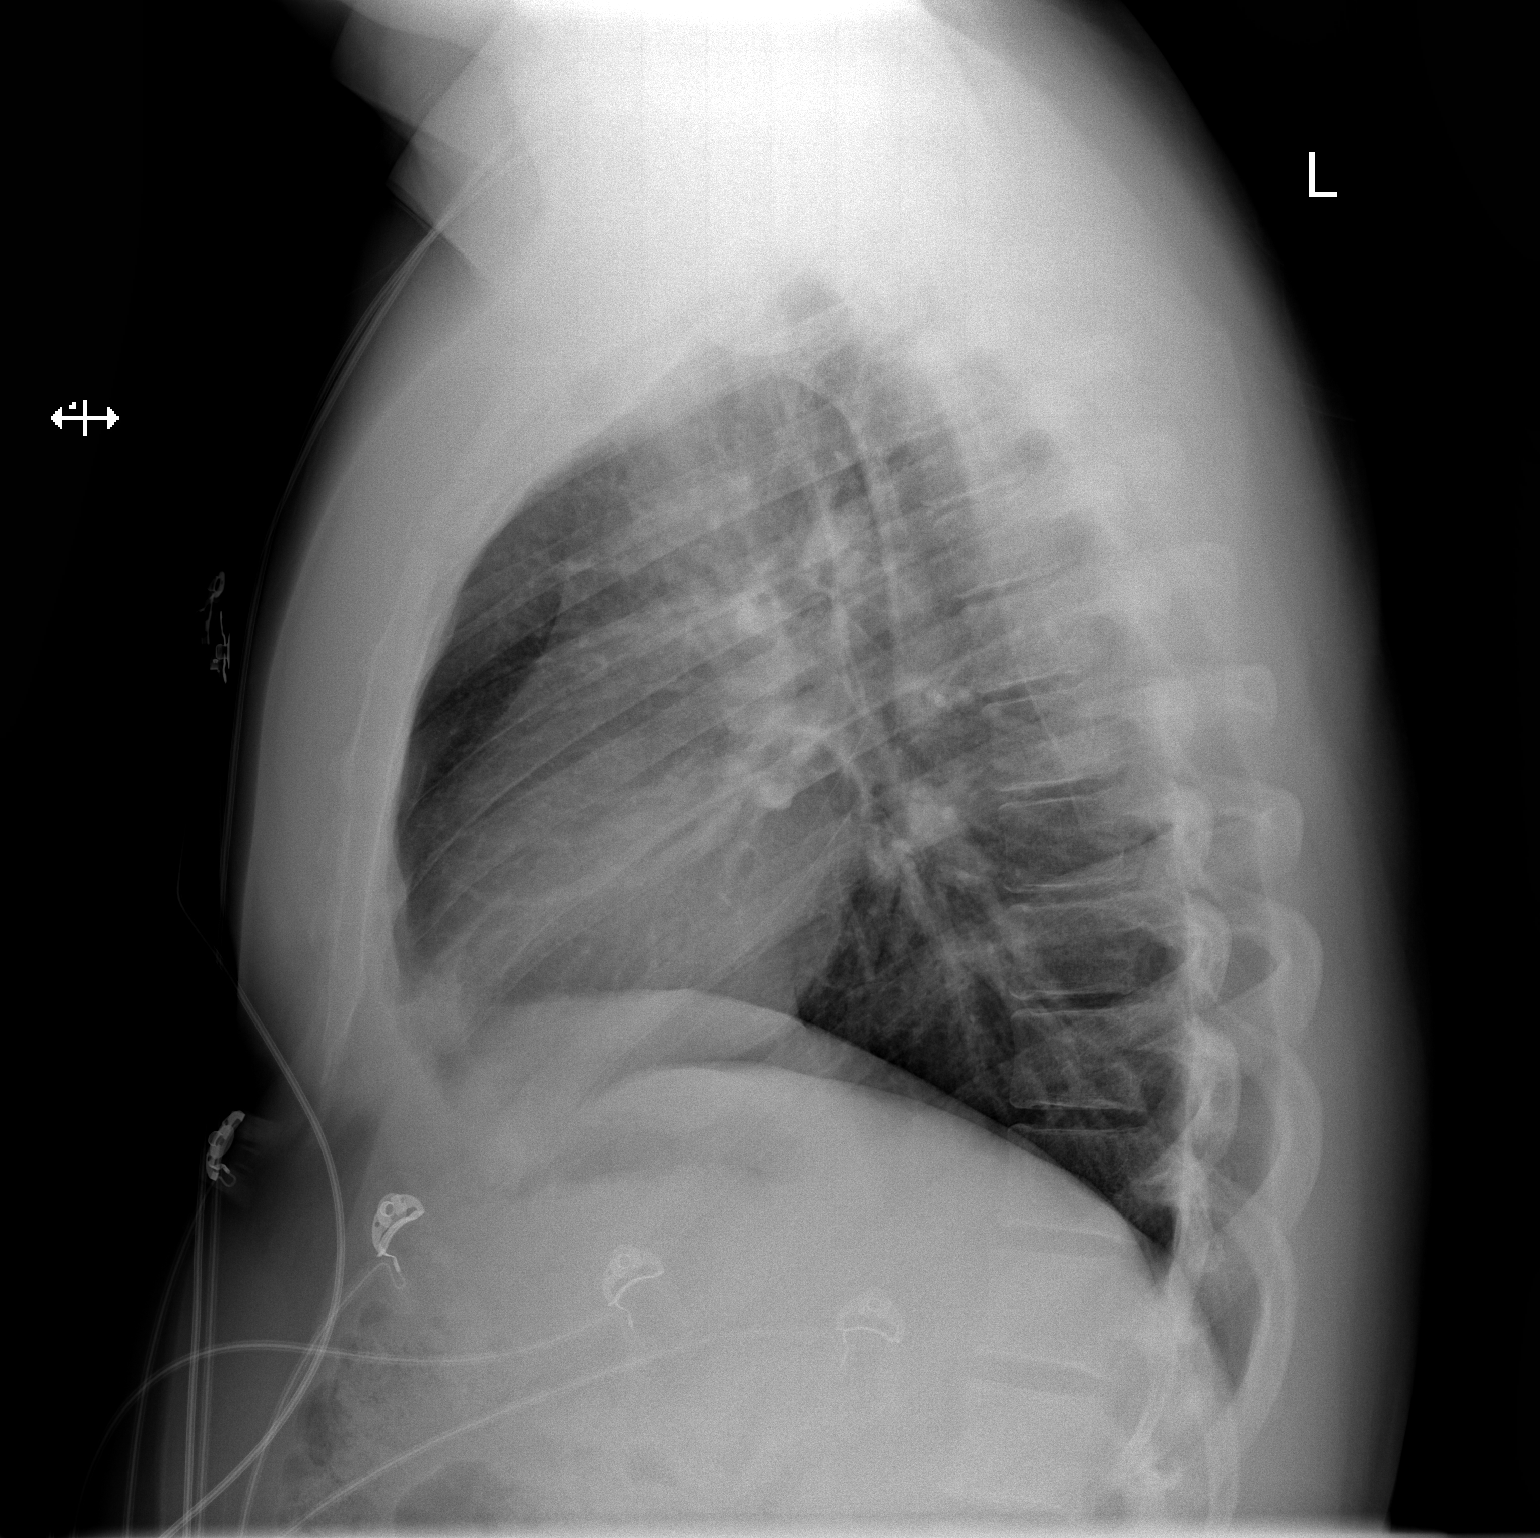

[2 of 2 positions shown; findings below may reference images not displayed]

FINDINGS: The heart size and mediastinal contours are within normal limits.
Both lungs are clear. The visualized skeletal structures are
unremarkable.
IMPRESSION: No acute abnormality of the lungs.

## 2022-01-18 IMAGING — CR DG CHEST 2V
2 series · 2 of 2 positions shown · non-contrast
Comparison: 12/26/2020

CLINICAL DATA: Chest pain

EXAM:
CHEST - 2 VIEW

[w chest pa]
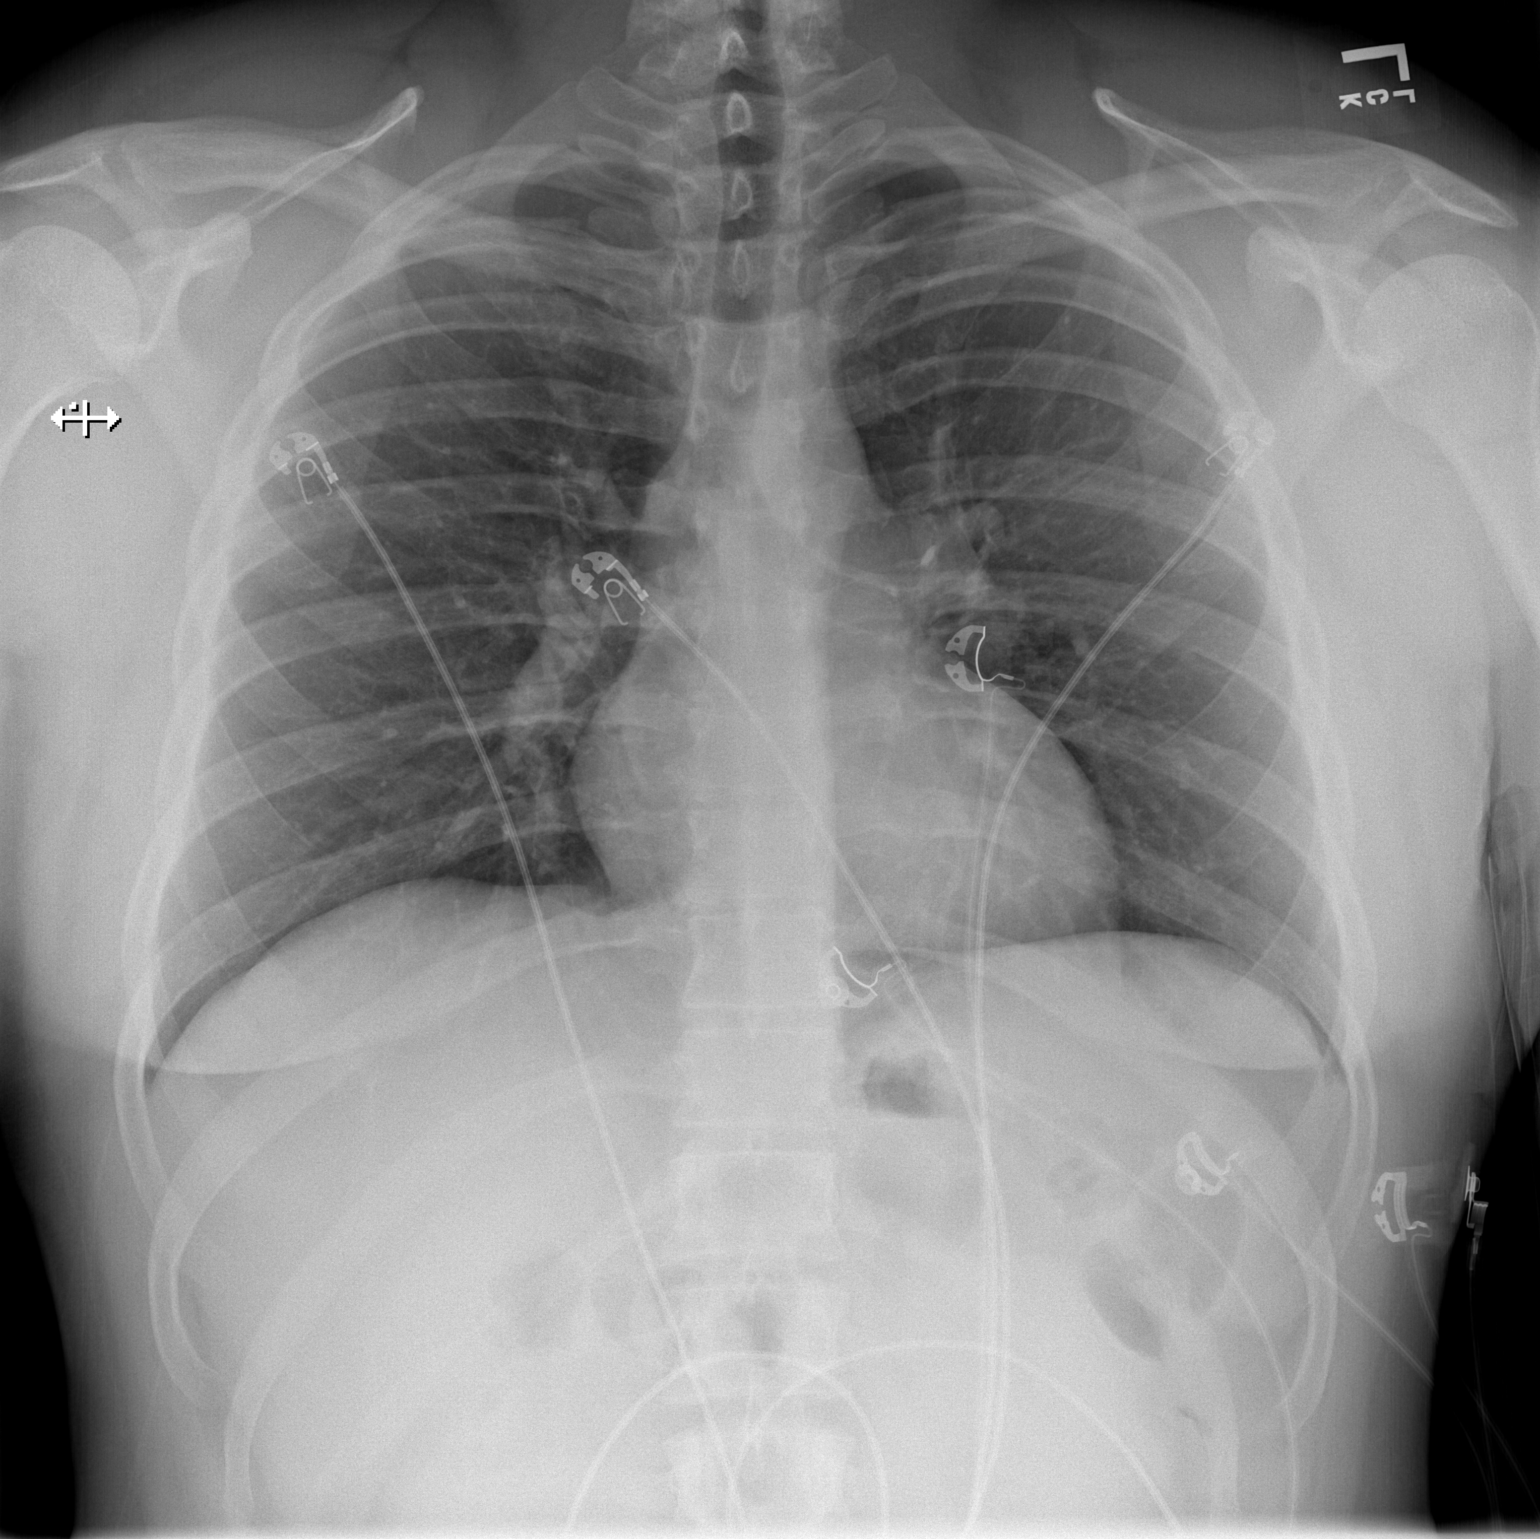

[w chest lat]
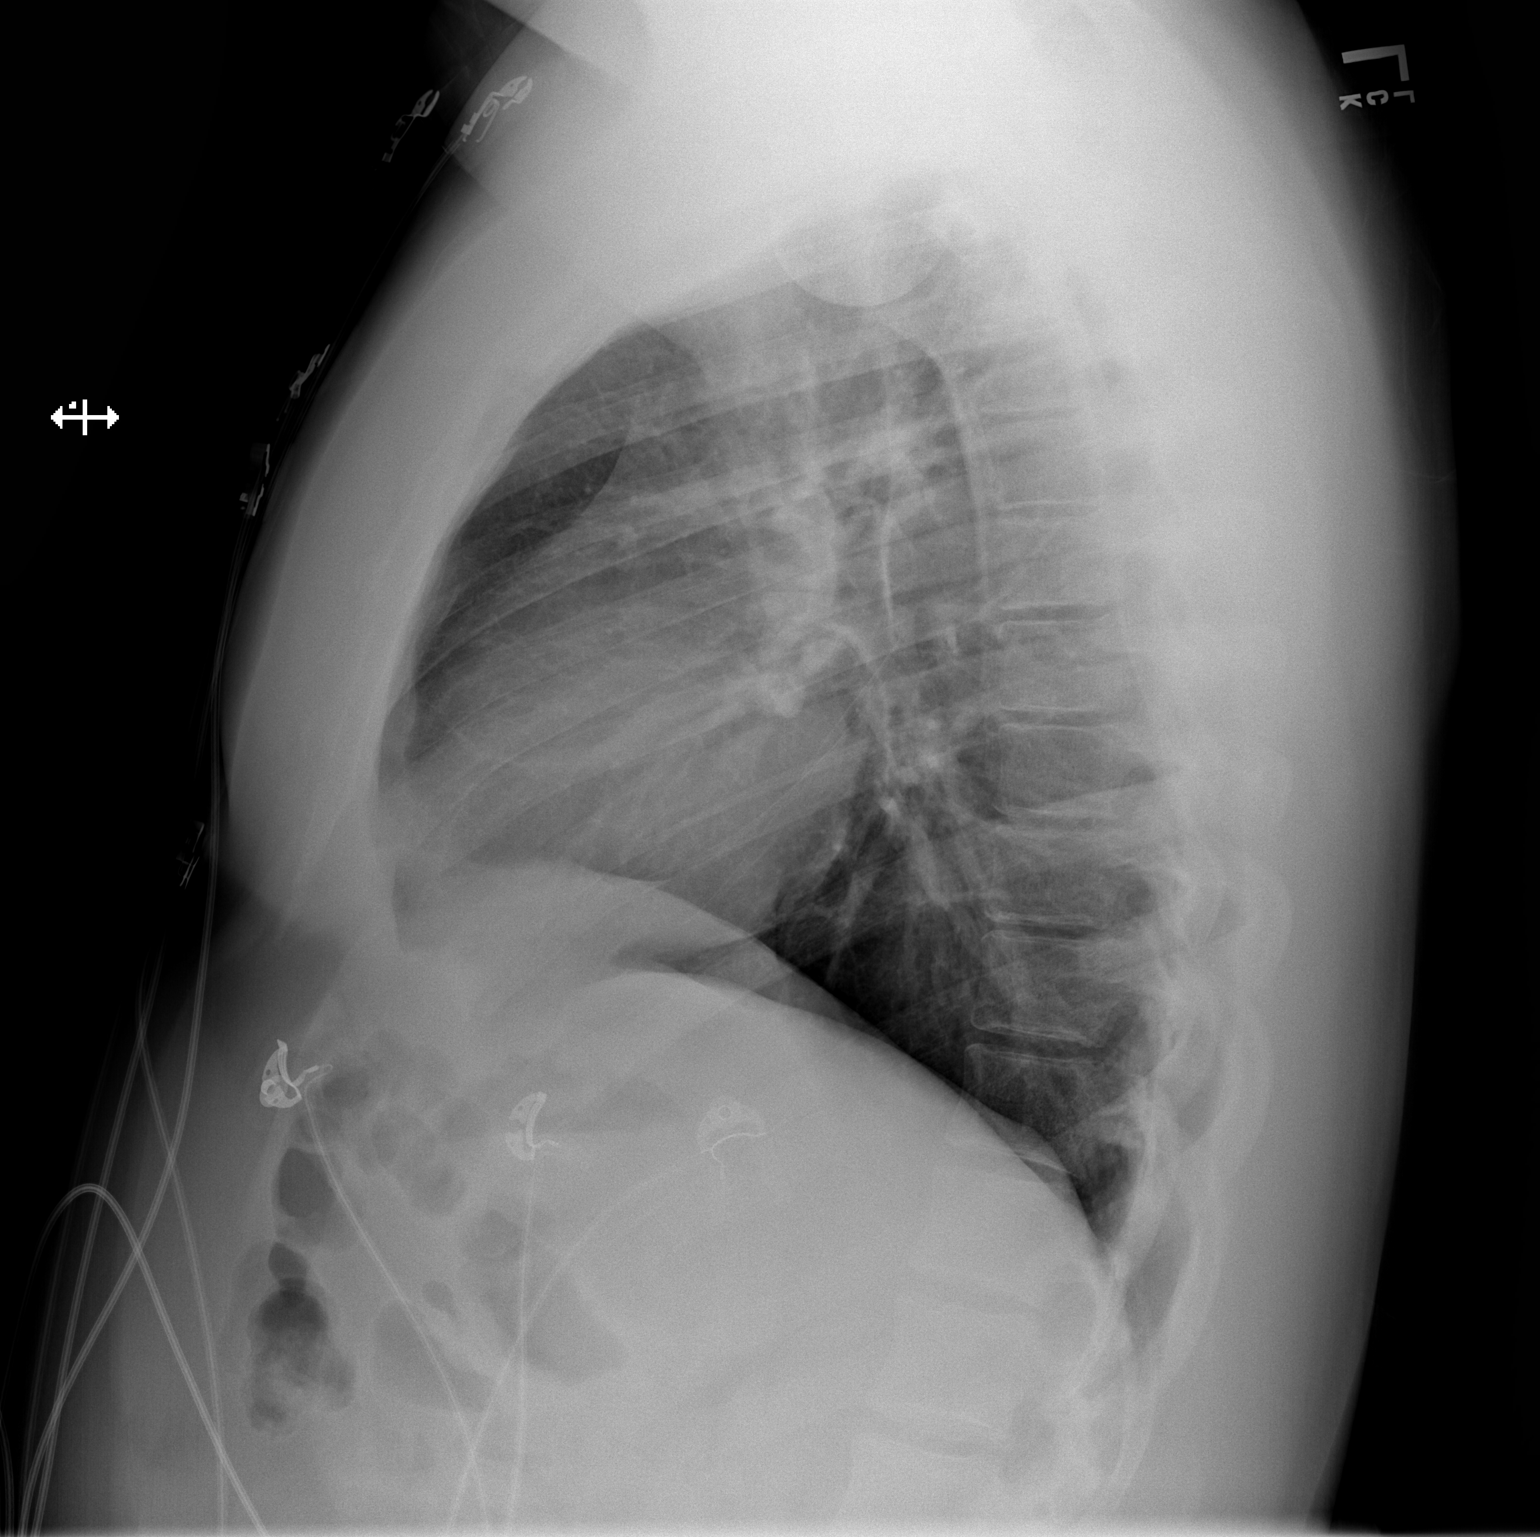

[2 of 2 positions shown; findings below may reference images not displayed]

FINDINGS: The heart size and mediastinal contours are within normal limits.
Both lungs are clear. The visualized skeletal structures are
unremarkable.
IMPRESSION: No active cardiopulmonary disease.

## 2022-01-21 IMAGING — US US SCROTUM W/ DOPPLER COMPLETE
1 series · 15 of 25 positions shown · non-contrast
Comparison: None.

CLINICAL DATA: Testicular pain for several weeks

EXAM:
SCROTAL ULTRASOUND
DOPPLER ULTRASOUND OF THE TESTICLES
TECHNIQUE: Complete ultrasound examination of the testicles, epididymis, and
other scrotal structures was performed. Color and spectral Doppler
ultrasound were also utilized to evaluate blood flow to the
testicles.

[Series 1: us art/ven flow abd pelv doppl mc & wl · 15 of 36 slices shown]
[im 1/36]
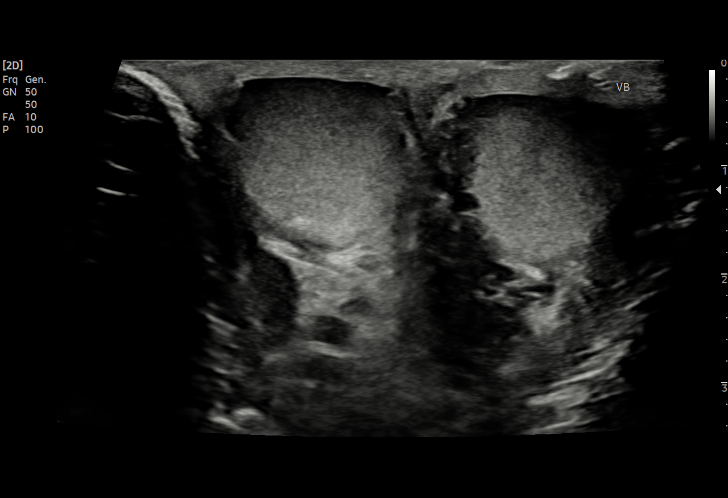
[im 3/36]
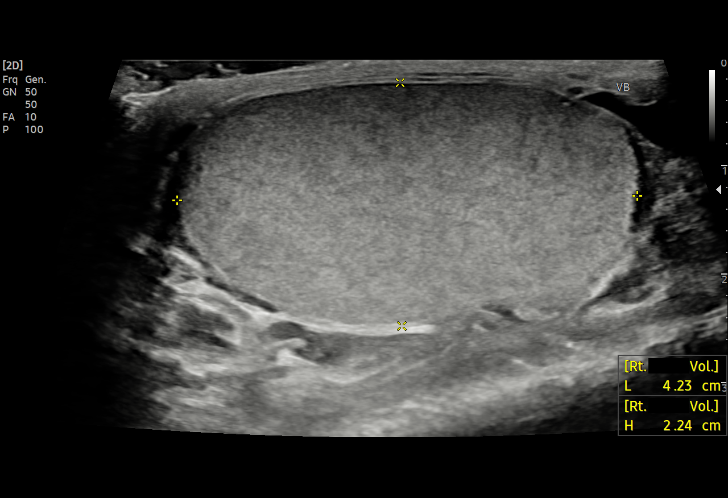
[im 6/36]
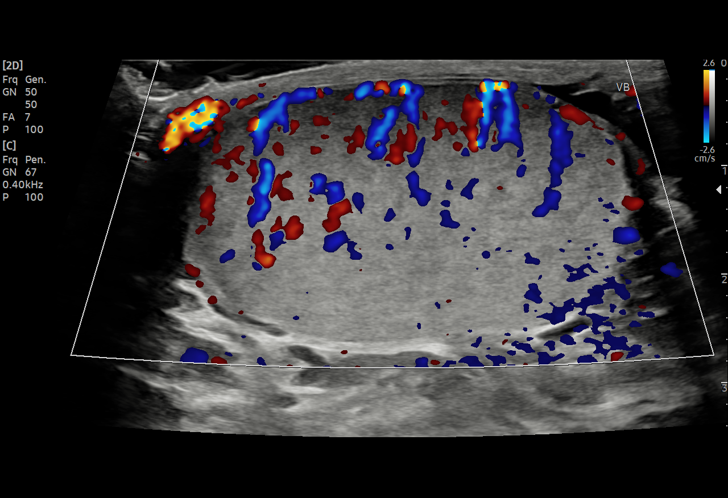
[im 8/36]
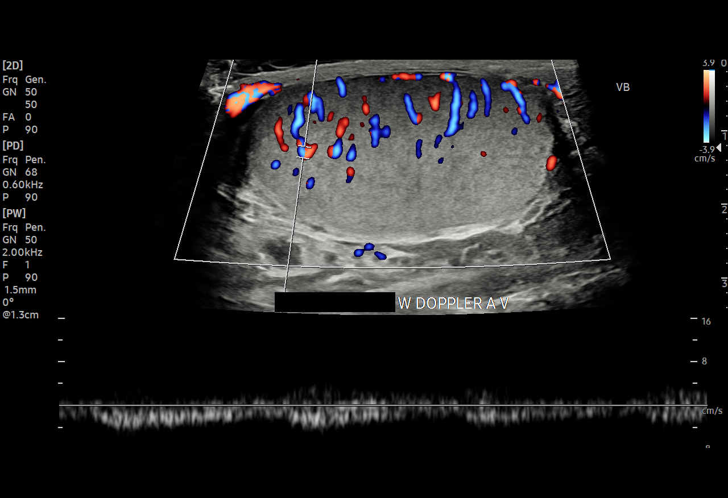
[im 11/36]
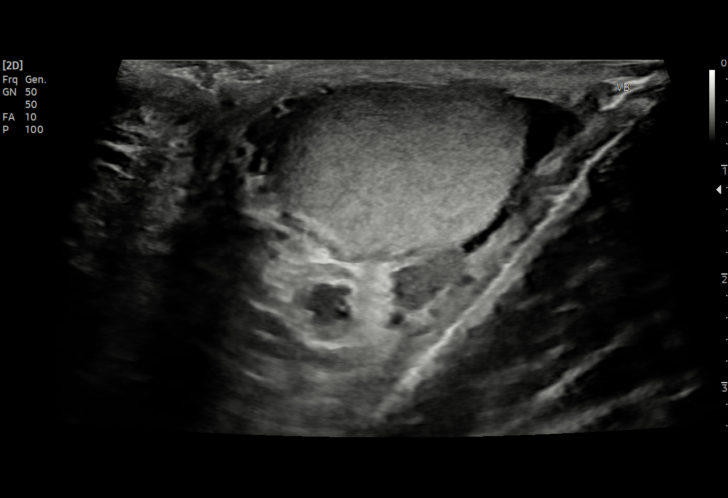
[im 14/36]
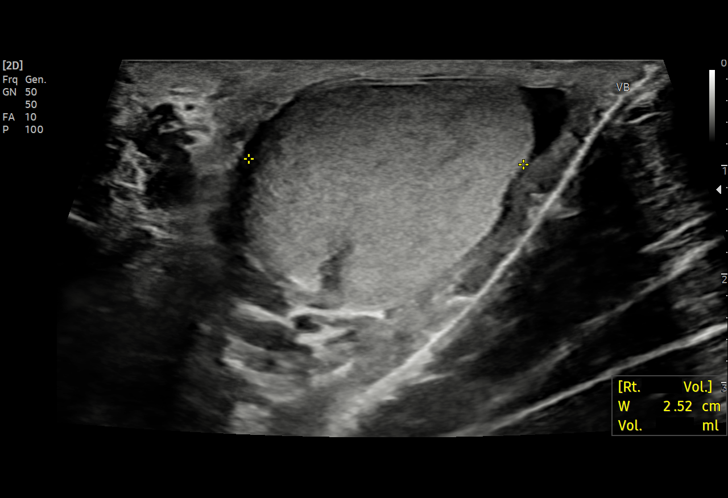
[im 15/36]
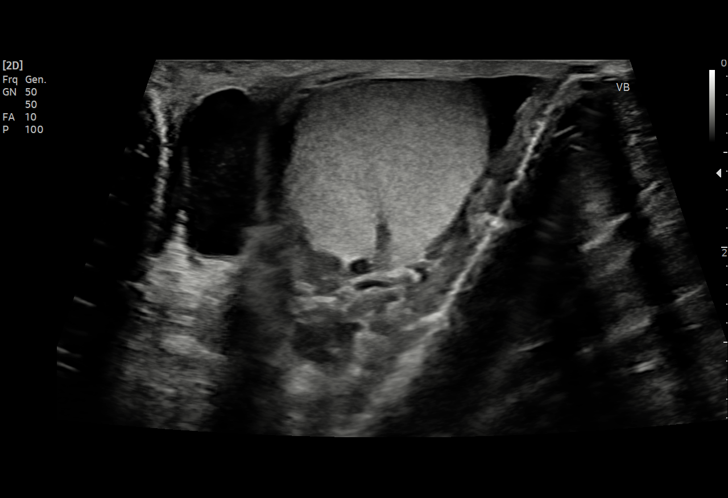
[im 18/36]
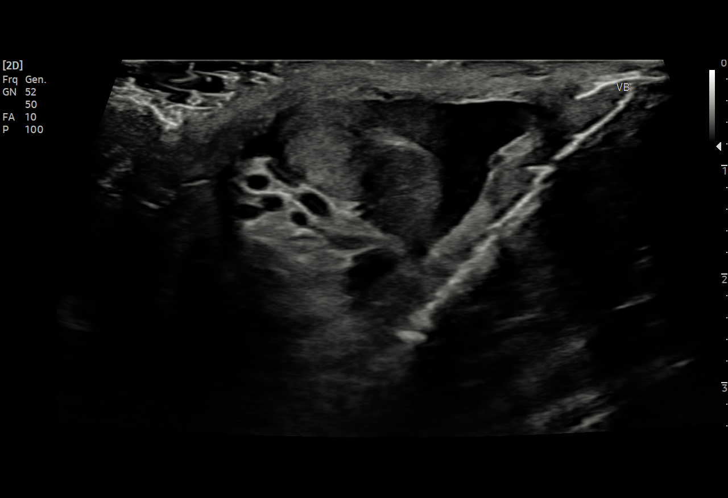
[im 21/36]
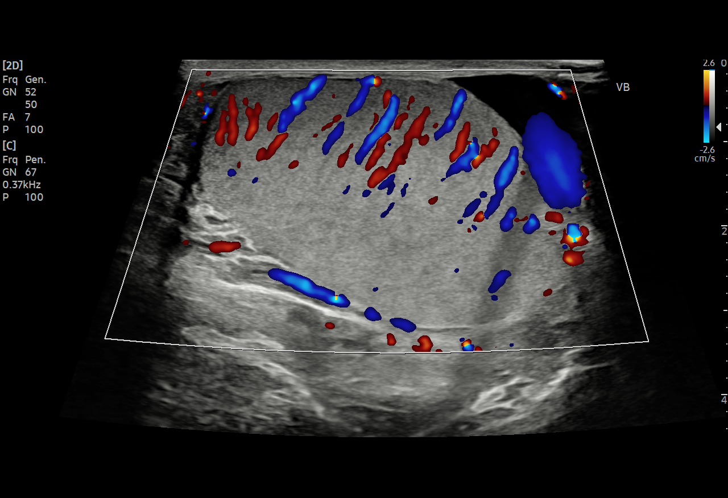
[im 22/36]
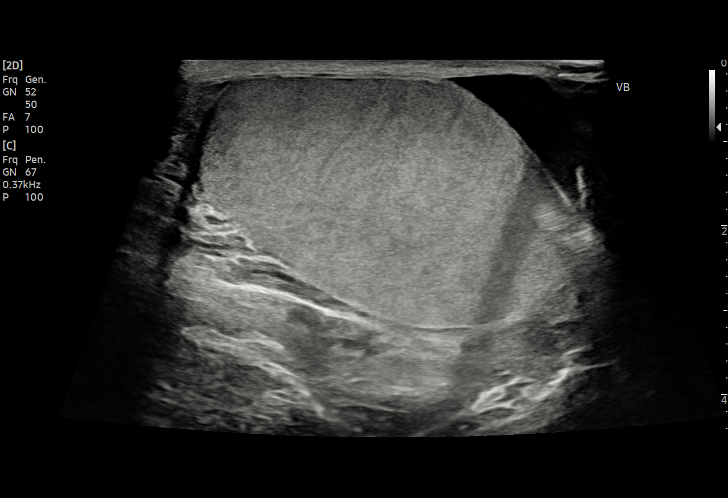
[im 25/36]
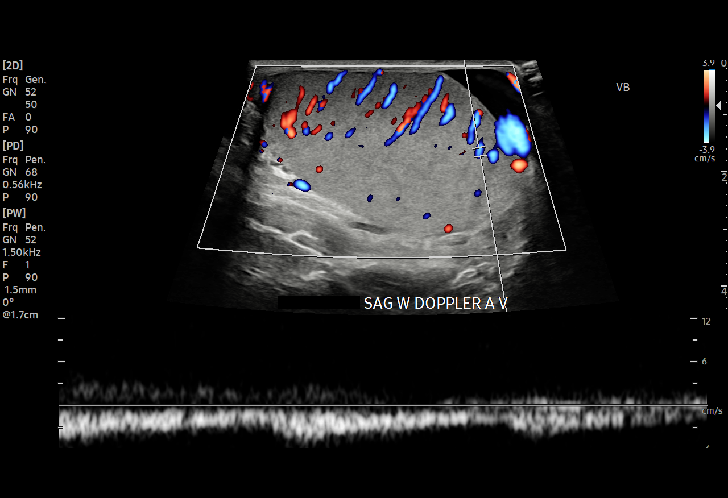
[im 28/36]
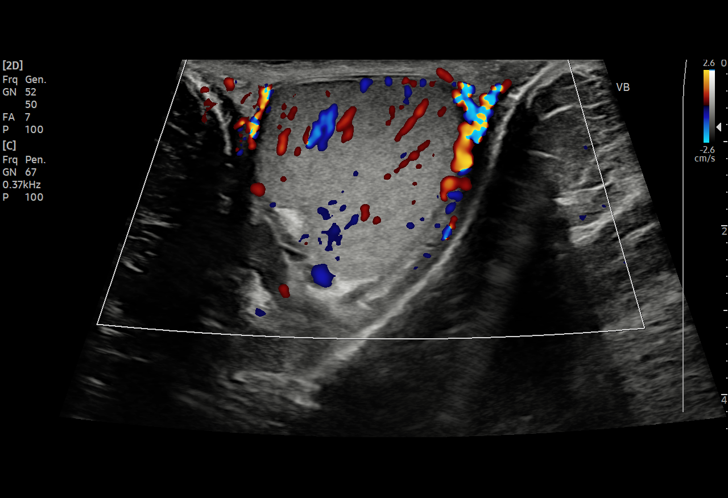
[im 30/36]
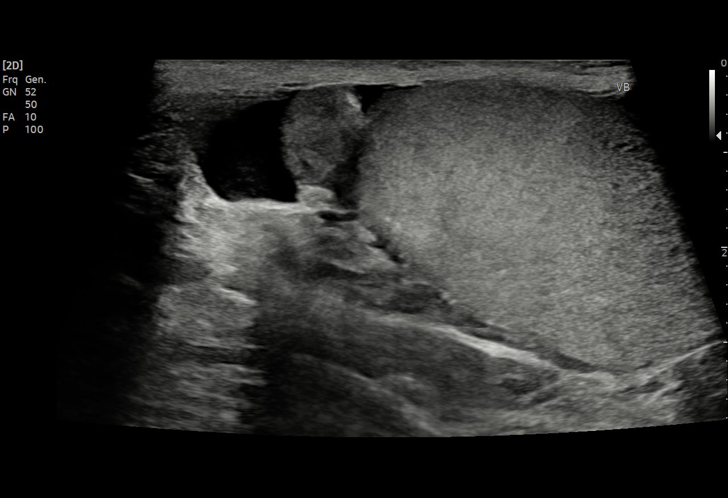
[im 33/36]
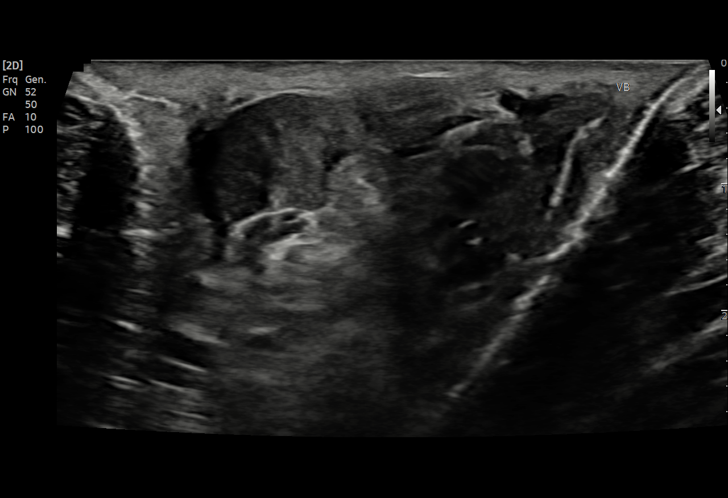
[im 36/36]
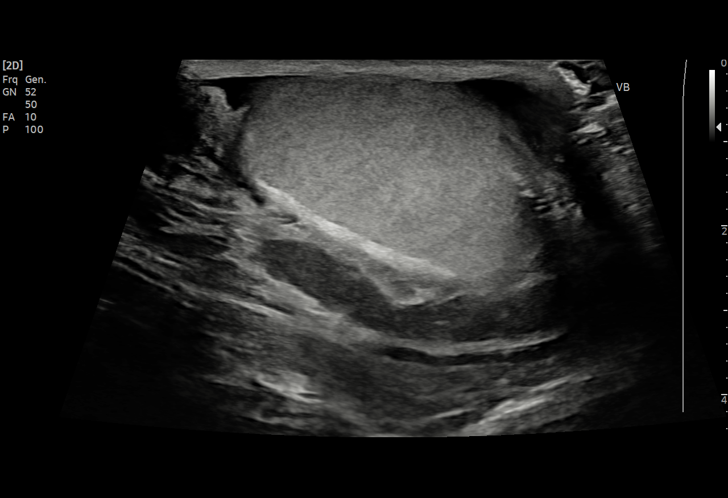

[15 of 25 positions shown; findings below may reference images not displayed]

FINDINGS: Right testicle

Measurements: 4.2 x 2.4 x 2.5 cm. No mass or microlithiasis
visualized.

Left testicle

Measurements: 0.6 x 2.8 x 2.4 cm. No mass or microlithiasis
visualized.

Right epididymis:  Normal in size and appearance.

Left epididymis:  Normal in size and appearance.

Hydrocele:  Small left hydrocele is noted.

Varicocele:  None visualized.

Pulsed Doppler interrogation of both testes demonstrates normal low
resistance arterial and venous waveforms bilaterally.
IMPRESSION: Small left hydrocele.

No testicular abnormality is identified.
